# Patient Record
Sex: Male | Born: 1995 | Race: White | Hispanic: No | Marital: Single | Smoking: Never smoker
Health system: Southern US, Community
[De-identification: ages and names within clinical notes are randomized; demographics above are authoritative.]

## PROBLEM LIST (undated history)

## (undated) HISTORY — PX: APPENDECTOMY: SHX54

---

## 2014-04-23 ENCOUNTER — Emergency Department (HOSPITAL_COMMUNITY)
Admission: EM | Admit: 2014-04-23 | Discharge: 2014-04-23 | Disposition: A | Discharge status: Home / Self Care | Payer: Managed Care, Other (non HMO) | Attending: Emergency Medicine | Admitting: Emergency Medicine

## 2014-04-23 ENCOUNTER — Encounter (HOSPITAL_COMMUNITY): Payer: Self-pay | Admitting: *Deleted

## 2014-04-23 DIAGNOSIS — R202 Paresthesia of skin: Secondary | ICD-10-CM | POA: Diagnosis not present

## 2014-04-23 DIAGNOSIS — R4582 Worries: Secondary | ICD-10-CM | POA: Insufficient documentation

## 2014-04-23 DIAGNOSIS — Z711 Person with feared health complaint in whom no diagnosis is made: Secondary | ICD-10-CM

## 2014-04-23 DIAGNOSIS — R001 Bradycardia, unspecified: Secondary | ICD-10-CM | POA: Diagnosis not present

## 2014-04-23 DIAGNOSIS — R51 Headache: Secondary | ICD-10-CM | POA: Insufficient documentation

## 2014-04-23 NOTE — Discharge Instructions (Signed)
Your exam tonight is normal. You should follow up with a primary care doctor for a complete physical and ongoing medical care. Return here as needed for problems.

## 2014-04-23 NOTE — ED Provider Notes (Signed)
CSN: [id number]     Arrival date & time 04/23/14  2105 History   First MD Initiated Contact with Patient 04/23/14 2128     Chief Complaint  Patient presents with  . Headache     (Consider location/radiation/quality/duration/timing/severity/associated sxs/prior Treatment) The history is provided by the patient.   Travis Vega is a 19 y.o. male who presents to the ED for evaluation of his left arm. He reports having been leaning against a fence today and his finger on his left hand started to tingle. It only lasted for a few seconds and he has had no episode since then. He denies numbness, tingling or pain at this time. Hx of headaches but has been worked up by several doctor for that. He has had a CT scan that was normal in December. Patient reports having used marijuana for the first time in October and after that started having the headaches. After his CT scan in December he stopped using until a month ago. When he started back the headache returned and he states he felt like he was in a fog. Today when he was leaning on the fence and he had the episode of tingling in his fingers tingling it was about 3pm. Tonight he started thinking about it and got scared that it may be something bad. Patient states he also went to [facility] ED and the doctor there told him he may have some anxiety. Patient states that whenever anything happens he is afraid there is something bad wrong.   History reviewed. No pertinent past medical history. Past Surgical History  Procedure Laterality Date  . Appendectomy     History reviewed. No pertinent family history. History  Substance Use Topics  . Smoking status: Never Smoker   . Smokeless tobacco: Not on file  . Alcohol Use: Yes     Comment: occasional    Review of Systems Negative except as stated in HPI   Allergies  Codeine and Zantac  Home Medications   Prior to Admission medications   Medication Sig Start Date End Date Taking? Authorizing Provider   ibuprofen (ADVIL,MOTRIN) 200 MG tablet Take 200 mg by mouth every 6 (six) hours as needed for mild pain.   Yes Historical Provider, MD   BP 150/86 mmHg  Pulse 57  Temp(Src) 98.6 F (37 C)  Resp 20  Ht  (1.753 m)  Wt 155 lb (70.308 kg)  BMI 22.88 kg/m2  SpO2 100% Physical Exam  Constitutional: He is oriented to person, place, and time. He appears well-developed and well-nourished. No distress.  HENT:  Head: Normocephalic.  Mouth/Throat: Uvula is midline, oropharynx is clear and moist and mucous membranes are normal.  Eyes: Conjunctivae and EOM are normal. Pupils are equal, round, and reactive to light.  Neck: Normal range of motion. Neck supple.  Cardiovascular: Regular rhythm.  Bradycardia present.   Pulmonary/Chest: Effort normal and breath sounds normal.  Musculoskeletal: Normal range of motion.  Grips strong and equal.   Neurological: He is alert and oriented to person, place, and time. He has normal strength. No cranial nerve deficit or sensory deficit. Coordination and gait normal.  Reflex Scores:      Bicep reflexes are 2+ on the right side and 2+ on the left side.      Brachioradialis reflexes are 2+ on the right side and 2+ on the left side.      Patellar reflexes are 2+ on the right side and 2+ on the left side. Skin: Skin is  warm and dry.  Psychiatric: He has a normal mood and affect. His behavior is normal.  Nursing note and vitals reviewed.   ED Course  Procedures (including critical care time) Labs Review Labs Reviewed - No data to display  I discussed this case with Dr. Blinda LeatherwoodPollina.   MDM  19 y.o. male with episode of tingling of his left finger today after leaning against a fence. No symptoms currently. Stable for d/c without focal neuro deficits. Discussed in detail with the patient need for primary care doctor to follow his health care rather than going to different ED's. He has been to at least 3 different ED's in the past 4 months. On each visit he states  he has been told that everything checked out normal.  There is no physical finding tonight that is indication for further testing. Discussed with the patient and all questioned fully answered. He will follow up with a PCP and return here as needed. List of local physicians given to the patient.   Final diagnoses:  Physically well but worried       [facility] M Neese, NP 04/23/14 2215  Gilda Creasehristopher J Pollina, MD 04/23/14 [id number]

## 2014-04-23 NOTE — ED Notes (Addendum)
Pt had and episode today where he was leaning up against a fence and his fingers on his left hand began to tingle. Pt states it only happened for a few seconds and is not experiencing any numbness or tinglin now or since the 1st episode.

## 2014-11-14 ENCOUNTER — Emergency Department (HOSPITAL_COMMUNITY): Payer: Managed Care, Other (non HMO)

## 2014-11-14 ENCOUNTER — Emergency Department (HOSPITAL_COMMUNITY)
Admission: EM | Admit: 2014-11-14 | Discharge: 2014-11-14 | Disposition: A | Discharge status: Home / Self Care | Payer: Managed Care, Other (non HMO) | Attending: Emergency Medicine | Admitting: Emergency Medicine

## 2014-11-14 ENCOUNTER — Encounter (HOSPITAL_COMMUNITY): Payer: Self-pay | Admitting: Emergency Medicine

## 2014-11-14 DIAGNOSIS — R1031 Right lower quadrant pain: Secondary | ICD-10-CM

## 2014-11-14 DIAGNOSIS — R1032 Left lower quadrant pain: Secondary | ICD-10-CM | POA: Diagnosis not present

## 2014-11-14 DIAGNOSIS — Z9049 Acquired absence of other specified parts of digestive tract: Secondary | ICD-10-CM | POA: Diagnosis not present

## 2014-11-14 DIAGNOSIS — N50811 Right testicular pain: Secondary | ICD-10-CM | POA: Diagnosis not present

## 2014-11-14 LAB — URINALYSIS, ROUTINE W REFLEX MICROSCOPIC
Bilirubin Urine: NEGATIVE
GLUCOSE, UA: NEGATIVE mg/dL
Hgb urine dipstick: NEGATIVE
Leukocytes, UA: NEGATIVE
NITRITE: NEGATIVE
PH: 7.5 (ref 5.0–8.0)
Protein, ur: NEGATIVE mg/dL
Specific Gravity, Urine: 1.02 (ref 1.005–1.030)
Urobilinogen, UA: 2 mg/dL — ABNORMAL HIGH (ref 0.0–1.0)

## 2014-11-14 MED ORDER — CEFTRIAXONE SODIUM 250 MG IJ SOLR
250.0000 mg | Freq: Once | INTRAMUSCULAR | Status: AC
  Administered 2014-11-14: 250 mg via INTRAMUSCULAR
  Filled 2014-11-14: qty 250

## 2014-11-14 MED ORDER — LIDOCAINE HCL (PF) 1 % IJ SOLN
INTRAMUSCULAR | Status: AC
  Administered 2014-11-14: 2.1 mL
  Filled 2014-11-14: qty 5

## 2014-11-14 MED ORDER — AZITHROMYCIN 250 MG PO TABS
1000.0000 mg | ORAL_TABLET | Freq: Once | ORAL | Status: AC
  Administered 2014-11-14: 1000 mg via ORAL
  Filled 2014-11-14: qty 4

## 2014-11-14 MED ORDER — DOXYCYCLINE HYCLATE 100 MG PO CAPS: 100.0000 mg | ORAL_CAPSULE | Freq: Two times a day (BID) | ORAL | Status: AC

## 2014-11-14 NOTE — ED Notes (Signed)
Pt seen and evaluated by EDPa for initial assessment. 

## 2014-11-14 NOTE — ED Provider Notes (Signed)
CSN: [id number]     Arrival date & time 11/14/14  1525 History  By signing my name below, I, Gwenyth Ober, attest that this documentation has been prepared under the direction and in the presence of Ahlaya Ende, PA-C.  Electronically Signed: Gwenyth Ober, ED Scribe. 11/14/2014. 4:12 PM.  Chief Complaint  Patient presents with  . Groin Pain    right and left   Patient is a 19 y.o. male presenting with groin pain. The history is provided by the patient. No language interpreter was used.  Groin Pain This is a new problem. The current episode started 12 to 24 hours ago. The problem occurs constantly. The problem has not changed since onset.Pertinent negatives include no abdominal pain. The symptoms are aggravated by walking. He has tried nothing for the symptoms. The treatment provided no relief.    HPI Comments: Travis Vega is a 19 y.o. male who presents to the Emergency Department complaining of constant, moderate, bilateral groin pain that started 12 hours ago. He states right testicular pain as an associated symptom. Pt also notes intermittent dysuria that started prior to the onset of symptoms. His pain becomes worse with walking and improves with being still. Pt reports onset of symptoms started abruptly without known injury. He denies penile pain, discharge or swelling, nausea, vomiting, abdominal pain, back pain, fever, genital lesions and rash as associated symptoms.  He also denies recent sexual encounters   History reviewed. No pertinent past medical history. Past Surgical History  Procedure Laterality Date  . Appendectomy     History reviewed. No pertinent family history. Social History  Substance Use Topics  . Smoking status: Never Smoker   . Smokeless tobacco: None  . Alcohol Use: Yes     Comment: occasional    Review of Systems  Gastrointestinal: Negative for nausea, vomiting and abdominal pain.  Genitourinary: Positive for testicular pain. Negative for penile  swelling, genital sores and penile pain.  Musculoskeletal: Negative for back pain.  Skin: Negative for rash.  All other systems reviewed and are negative.  Allergies  Codeine and Zantac  Home Medications   Prior to Admission medications   Medication Sig Start Date End Date Taking? Authorizing Provider  ibuprofen (ADVIL,MOTRIN) 200 MG tablet Take 200 mg by mouth every 6 (six) hours as needed for mild pain.    Historical Provider, MD   BP 132/86 mmHg  Pulse 82  Temp(Src) 98.3 F (36.8 C) (Oral)  Resp 18  Ht  (1.753 m)  Wt 155 lb (70.308 kg)  BMI 22.88 kg/m2  SpO2 100% Physical Exam  Constitutional: He appears well-developed and well-nourished. No distress.  HENT:  Head: Normocephalic and atraumatic.  Eyes: Conjunctivae and EOM are normal.  Neck: Neck supple. No tracheal deviation present.  Cardiovascular: Normal rate, regular rhythm and normal heart sounds.   Pulmonary/Chest: Effort normal and breath sounds normal. No respiratory distress. He has no wheezes.  Abdominal: Hernia confirmed negative in the right inguinal area and confirmed negative in the left inguinal area.  Genitourinary: Cremasteric reflex is present. Right testis shows tenderness. Right testis shows no mass. Left testis shows no mass. Circumcised. No penile erythema or penile tenderness. No discharge found.  GC/Chlamydia swab obtained. Chaperoned by nurse.  No obvious scrotal masses or edema.   Skin: Skin is warm and dry.  Psychiatric: He has a normal mood and affect. His behavior is normal.  Nursing note and vitals reviewed.   ED Course  Procedures  DIAGNOSTIC STUDIES: Oxygen Saturation  is 100% on RA, normal by my interpretation.    COORDINATION OF CARE: 4:11 PM Discussed treatment plan with pt at bedside and pt agreed to plan.  Labs Review Labs Reviewed  URINALYSIS, ROUTINE W REFLEX MICROSCOPIC (NOT AT [facility])  RPR  HIV ANTIBODY (ROUTINE TESTING)  GC/CHLAMYDIA PROBE AMP ([place]) NOT AT  [facility]    Imaging Review US Scrotum  11/14/2014  CLINICAL DATA:  19 year old with bilateral scrotal and groin pain which began yesterday. No injury. EXAM: SCROTAL ULTRASOUND DOPPLER ULTRASOUND OF THE TESTICLES TECHNIQUE: Complete ultrasound examination of the testicles, epididymis, and other scrotal structures was performed. Color and spectral Doppler ultrasound were also utilized to evaluate blood flow to the testicles. COMPARISON:  None. FINDINGS: Right testicle Measurements: Approximately 4.0 x 1.9 x 3.2 cm. Normal parenchymal echotexture without mass or microlithiasis. Normal color Doppler flow without evidence of hyperemia. Left testicle Measurements: Approximately 4.0 x 1.6 x 2.7 cm. Normal parenchymal echotexture without mass or microlithiasis. Normal color Doppler flow without evidence of hyperemia. Right epididymis: Normal in size and appearance without evidence of hyperemia. Left epididymis: Normal in size and appearance without evidence of hyperemia. Hydrocele:  None visualized. Varicocele:  None visualized. Pulsed Doppler interrogation of both testes demonstrates normal low resistance arterial and venous waveforms bilaterally. Measurements: Approximately 4.0 x 1.9 x 3.2 cm. Normal parenchymal echotexture without mass or microlithiasis. Normal color Doppler flow without evidence of hyperemia. IMPRESSION: Normal examination. Electronically Signed   By: Hulan Saas M.D.   On: 11/14/2014 17:50   [place] Art/ven Flow Abd Pelv Doppler  11/14/2014  CLINICAL DATA:  19 year old with bilateral scrotal and groin pain which began yesterday. No injury. EXAM: SCROTAL ULTRASOUND DOPPLER ULTRASOUND OF THE TESTICLES TECHNIQUE: Complete ultrasound examination of the testicles, epididymis, and other scrotal structures was performed. Color and spectral Doppler ultrasound were also utilized to evaluate blood flow to the testicles. COMPARISON:  None. FINDINGS: Right testicle Measurements: Approximately 4.0 x 1.9 x 3.2  cm. Normal parenchymal echotexture without mass or microlithiasis. Normal color Doppler flow without evidence of hyperemia. Left testicle Measurements: Approximately 4.0 x 1.6 x 2.7 cm. Normal parenchymal echotexture without mass or microlithiasis. Normal color Doppler flow without evidence of hyperemia. Right epididymis: Normal in size and appearance without evidence of hyperemia. Left epididymis: Normal in size and appearance without evidence of hyperemia. Hydrocele:  None visualized. Varicocele:  None visualized. Pulsed Doppler interrogation of both testes demonstrates normal low resistance arterial and venous waveforms bilaterally. Measurements: Approximately 4.0 x 1.9 x 3.2 cm. Normal parenchymal echotexture without mass or microlithiasis. Normal color Doppler flow without evidence of hyperemia. IMPRESSION: Normal examination. Electronically Signed   By: Hulan Saas M.D.   On: 11/14/2014 17:50    I have personally reviewed and evaluated these lab results as part of my medical decision-making.   EKG Interpretation None      MDM   Final diagnoses:  Bilateral groin pain   Pt is well appearing.  Vitals stable.  Laughing and talking with friend at bedside.  Mild tenderness to the right testis.  No erythema or edema.  Penis nml, no palpable hernias.  Will tx with IM rocephin and zithromax and rx doxy for possible epididymitis.  Pt agrees to PMD f/u and given return instructions if sx worsen.    I personally performed the services described in this documentation, which was scribed in my presence. The recorded information has been reviewed and is accurate.    Pauline Aus, PA-C 11/17/14 2331  Glynn Octave,  MD 11/18/14 1507

## 2014-11-14 NOTE — ED Notes (Signed)
Pain to right and left groin, rates pain 7/10.  Denies injury.

## 2014-11-15 LAB — GC/CHLAMYDIA PROBE AMP (~~LOC~~) NOT AT ARMC
Chlamydia: NEGATIVE
NEISSERIA GONORRHEA: NEGATIVE

## 2014-11-15 LAB — HIV ANTIBODY (ROUTINE TESTING W REFLEX): HIV Screen 4th Generation wRfx: NONREACTIVE

## 2014-11-15 LAB — RPR: RPR Ser Ql: NONREACTIVE

## 2014-11-29 ENCOUNTER — Other Ambulatory Visit (HOSPITAL_COMMUNITY)
Admission: RE | Admit: 2014-11-29 | Discharge: 2014-11-29 | Disposition: A | Discharge status: Home / Self Care | Payer: Managed Care, Other (non HMO) | Source: Other Acute Inpatient Hospital | Attending: Emergency Medicine | Admitting: Emergency Medicine

## 2014-11-30 LAB — RPR: RPR: NONREACTIVE

## 2014-11-30 LAB — HIV ANTIBODY (ROUTINE TESTING W REFLEX): HIV SCREEN 4TH GENERATION: NONREACTIVE

## 2017-10-02 IMAGING — US US SCROTUM
1 series · 13 of 25 positions shown · non-contrast
Comparison: None.

CLINICAL DATA: 18-year-old with bilateral scrotal and groin pain
which began yesterday. No injury.

EXAM:
SCROTAL ULTRASOUND
DOPPLER ULTRASOUND OF THE TESTICLES
TECHNIQUE: Complete ultrasound examination of the testicles, epididymis, and
other scrotal structures was performed. Color and spectral Doppler
ultrasound were also utilized to evaluate blood flow to the
testicles.

[Series 1: us scrotum · 0.07mm/px · 13 of 55 slices shown]
[im 1/55]
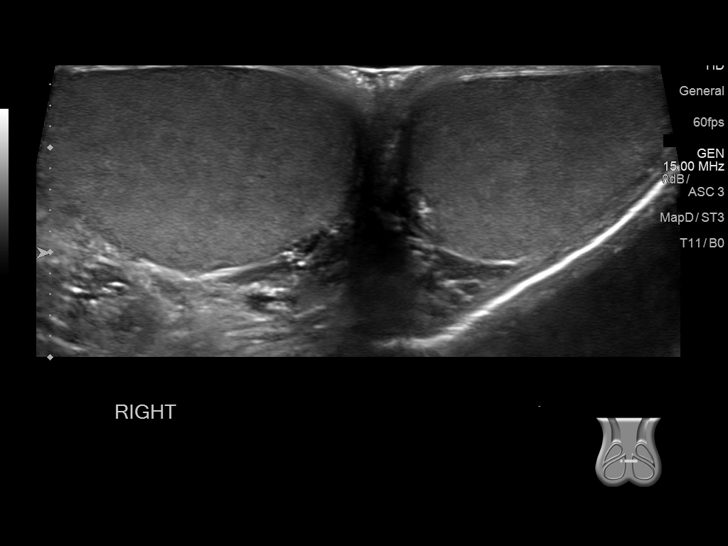
[im 5/55]
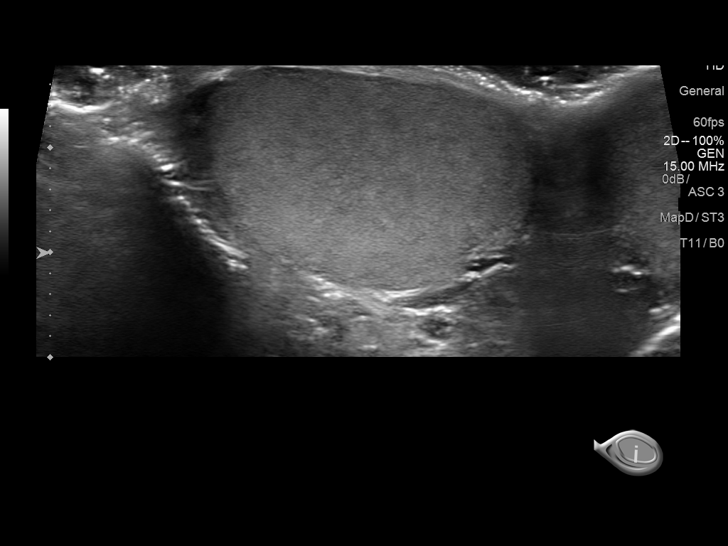
[im 10/55]
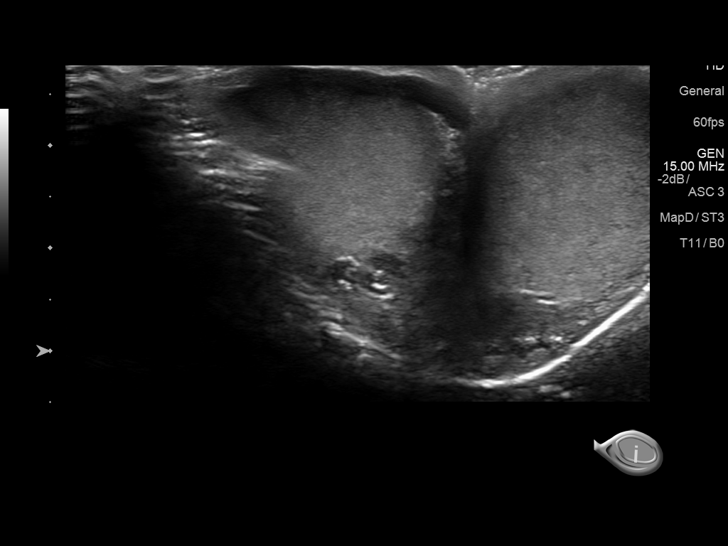
[im 14/55]
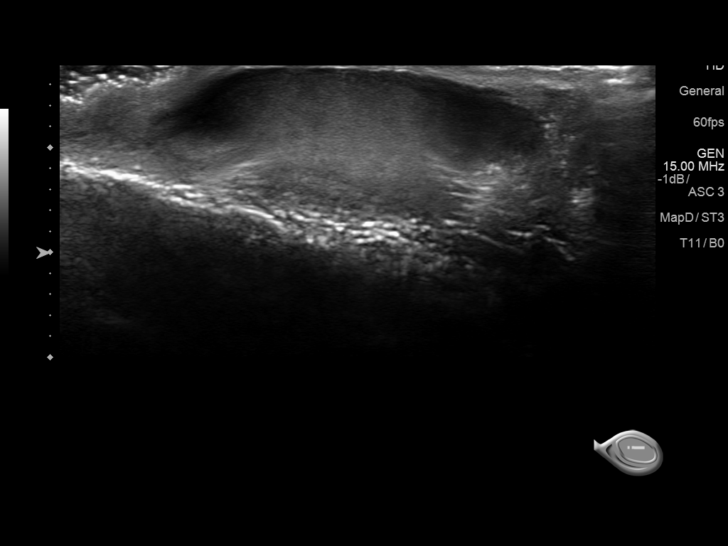
[im 19/55]
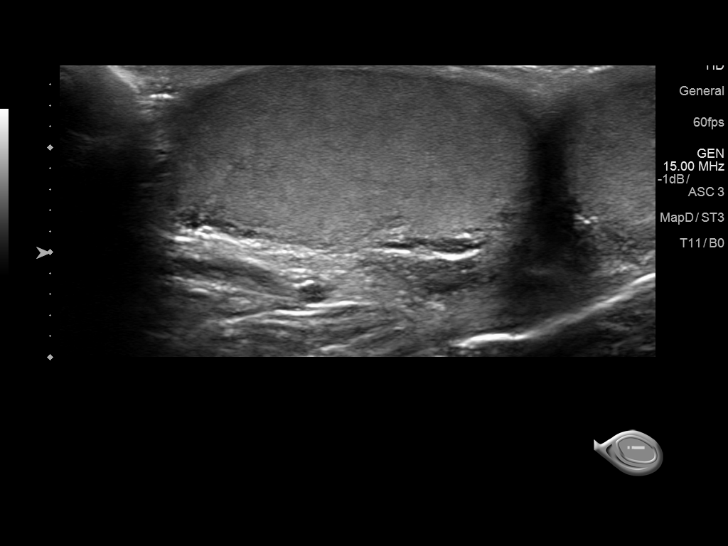
[im 23/55]
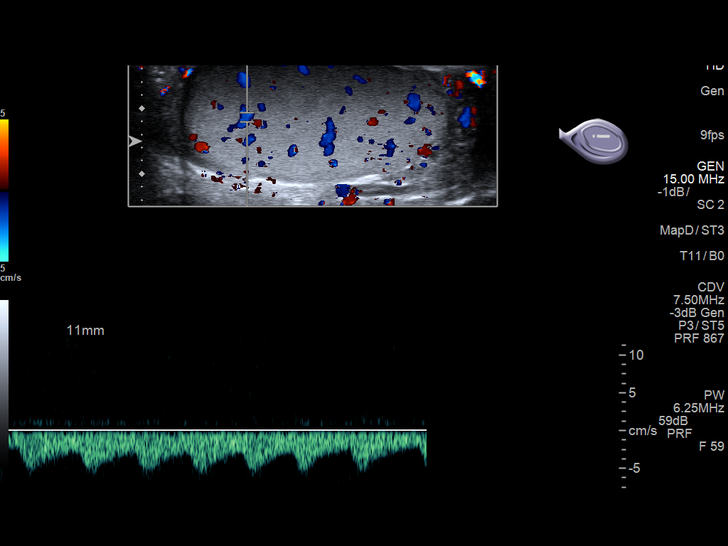
[im 28/55]
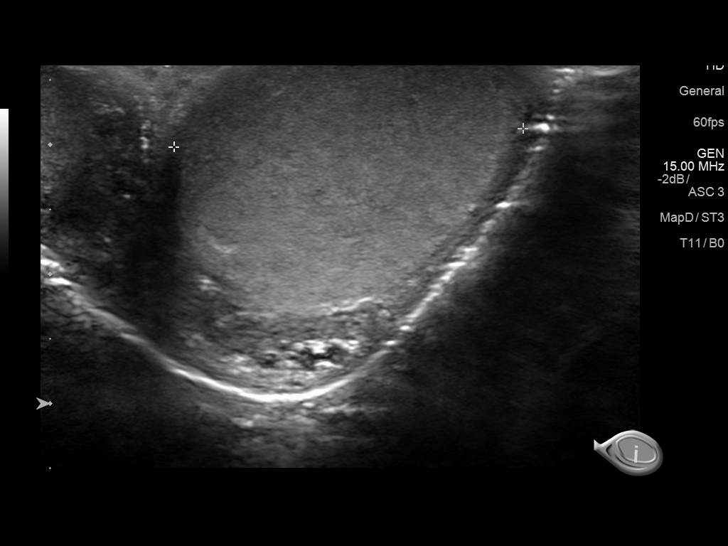
[im 32/55]
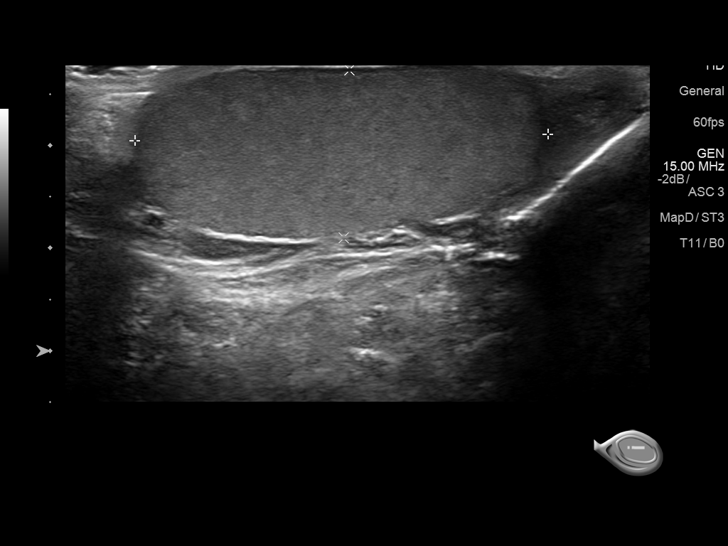
[im 37/55]
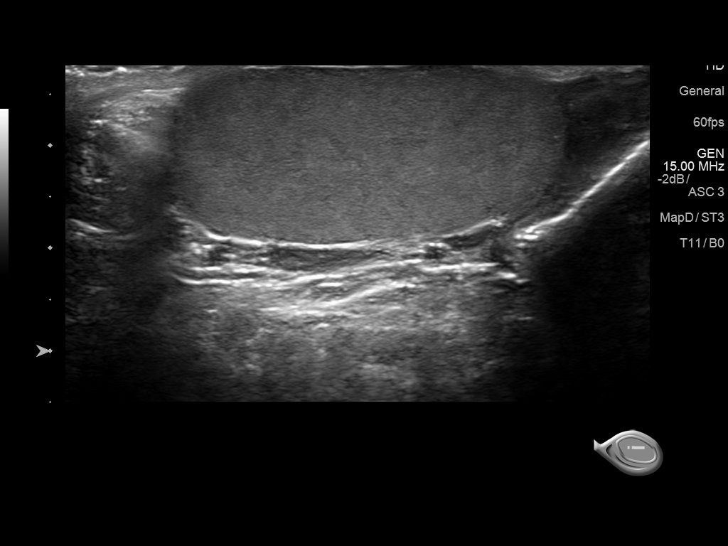
[im 41/55]
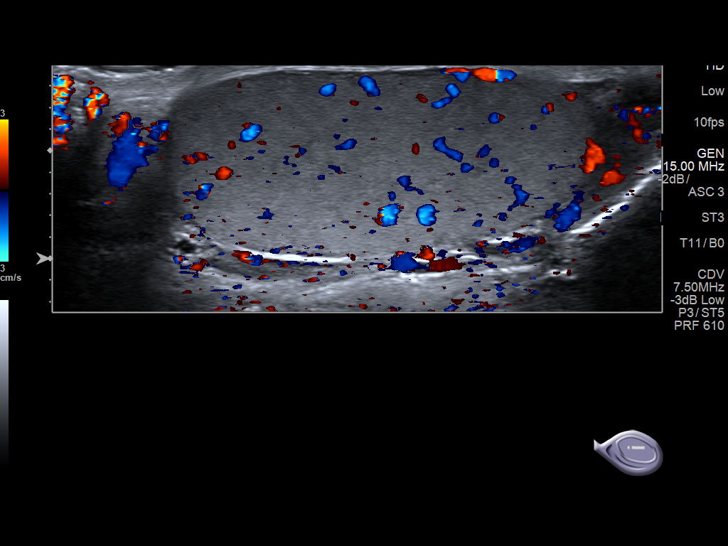
[im 46/55]
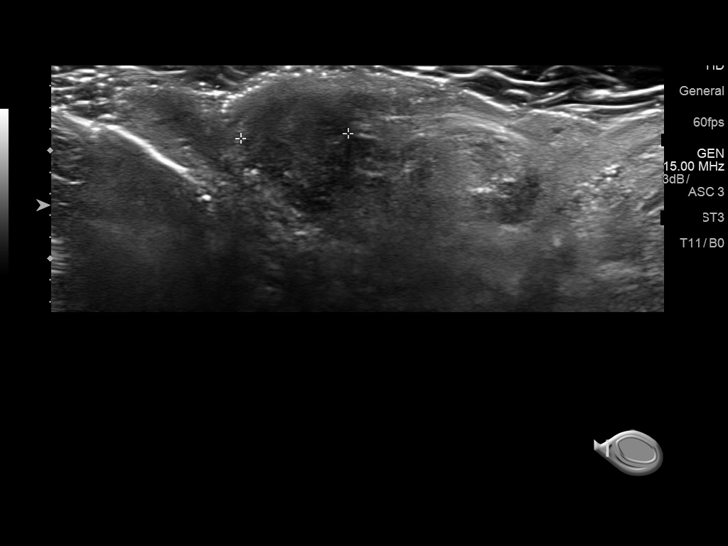
[im 50/55]
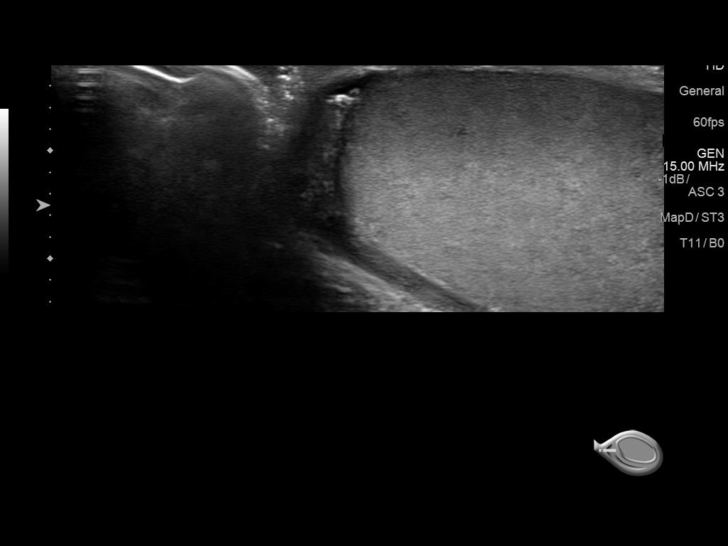
[im 55/55]
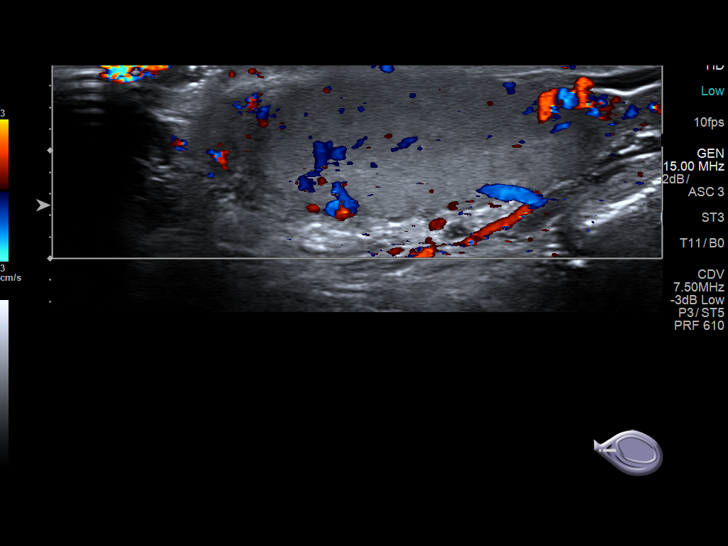

[13 of 25 positions shown; findings below may reference images not displayed]

FINDINGS: Right testicle

Measurements: Approximately 4.0 x 1.9 x 3.2 cm. Normal parenchymal
echotexture without mass or microlithiasis. Normal color Doppler
flow without evidence of hyperemia.

Left testicle

Measurements: Approximately 4.0 x 1.6 x 2.7 cm. Normal parenchymal
echotexture without mass or microlithiasis. Normal color Doppler
flow without evidence of hyperemia.

Right epididymis: Normal in size and appearance without evidence of
hyperemia.

Left epididymis: Normal in size and appearance without evidence of
hyperemia.

Hydrocele:  None visualized.

Varicocele:  None visualized.

Pulsed Doppler interrogation of both testes demonstrates normal low
resistance arterial and venous waveforms bilaterally.

Measurements: Approximately 4.0 x 1.9 x 3.2 cm. Normal parenchymal
echotexture without mass or microlithiasis. Normal color Doppler
flow without evidence of hyperemia.
IMPRESSION: Normal examination.
